# Patient Record
Sex: Male | Born: 1970 | Race: White | Hispanic: No | Marital: Married | Smoking: Current every day smoker
Health system: Southern US, Community
[De-identification: ages and names within clinical notes are randomized; demographics above are authoritative.]

---

## 2000-03-30 ENCOUNTER — Emergency Department (HOSPITAL_COMMUNITY): Admission: EM | Admit: 2000-03-30 | Discharge: 2000-03-30 | Payer: Self-pay | Admitting: Emergency Medicine

## 2011-04-19 ENCOUNTER — Emergency Department (HOSPITAL_BASED_OUTPATIENT_CLINIC_OR_DEPARTMENT_OTHER)
Admission: EM | Admit: 2011-04-19 | Discharge: 2011-04-19 | Disposition: A | Discharge status: Home / Self Care | Payer: Managed Care, Other (non HMO) | Attending: Emergency Medicine | Admitting: Emergency Medicine

## 2011-04-19 ENCOUNTER — Encounter (HOSPITAL_BASED_OUTPATIENT_CLINIC_OR_DEPARTMENT_OTHER): Payer: Self-pay | Admitting: *Deleted

## 2011-04-19 DIAGNOSIS — S61209A Unspecified open wound of unspecified finger without damage to nail, initial encounter: Secondary | ICD-10-CM | POA: Insufficient documentation

## 2011-04-19 DIAGNOSIS — W268XXA Contact with other sharp object(s), not elsewhere classified, initial encounter: Secondary | ICD-10-CM | POA: Insufficient documentation

## 2011-04-19 DIAGNOSIS — F17200 Nicotine dependence, unspecified, uncomplicated: Secondary | ICD-10-CM | POA: Insufficient documentation

## 2011-04-19 DIAGNOSIS — S61219A Laceration without foreign body of unspecified finger without damage to nail, initial encounter: Secondary | ICD-10-CM

## 2011-04-19 DIAGNOSIS — Y9269 Other specified industrial and construction area as the place of occurrence of the external cause: Secondary | ICD-10-CM | POA: Insufficient documentation

## 2011-04-19 MED ORDER — LIDOCAINE HCL 2 % IJ SOLN
20.0000 mL | Freq: Once | INTRAMUSCULAR | Status: DC
  Filled 2011-04-19: qty 1

## 2011-04-19 NOTE — ED Notes (Signed)
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 Approx 1-1/2 cm lac to left middle finger. Bleeding controlled. Moves finger . Feels touch. Cap refill < 3 sec.

## 2011-04-19 NOTE — Discharge Instructions (Signed)
Laceration Care, Adult A laceration is a cut that goes through all layers of the skin. The cut goes into the tissue beneath the skin. HOME CARE For stitches (sutures) or staples:  Keep the cut clean and dry.   If you have a bandage (dressing), change it at least once a day. Change the bandage if it gets wet or dirty, or as told by your doctor.   Wash the cut with soap and water 2 times a day. Rinse the cut with water. Pat it dry with a clean towel.   Put a thin layer of medicated cream on the cut as told by your doctor.   You may shower after the first 24 hours. Do not soak the cut in water until the stitches are removed.   Only take medicines as told by your doctor.   Have your stitches or staples removed as told by your doctor.  For skin adhesive strips:  Keep the cut clean and dry.   Do not get the strips wet. You may take a bath, but be careful to keep the cut dry.   If the cut gets wet, pat it dry with a clean towel.   The strips will fall off on their own. Do not remove the strips that are still stuck to the cut.  For wound glue:  You may shower or take baths. Do not soak or scrub the cut. Do not swim. Avoid heavy sweating until the glue falls off on its own. After a shower or bath, pat the cut dry with a clean towel.   Do not put medicine on your cut until the glue falls off.   If you have a bandage, do not put tape over the glue.   Avoid lots of sunlight or tanning lamps until the glue falls off. Put sunscreen on the cut for the first year to reduce your scar.   The glue will fall off on its own. Do not pick at the glue.  You may need a tetanus shot if:  You cannot remember when you had your last tetanus shot.   You have never had a tetanus shot.  If you need a tetanus shot and you choose not to have one, you may get tetanus. Sickness from tetanus can be serious. GET HELP RIGHT AWAY IF:   Your pain does not get better with medicine.   Your arm, hand, leg, or  foot loses feeling (numbness) or changes color.   Your cut is bleeding.   Your joint feels weak, or you cannot use your joint.   You have painful lumps on your body.   Your cut is red, puffy (swollen), or painful.   You have a red line on the skin near the cut.   You have yellowish-white fluid (pus) coming from the cut.   You have a fever.   You have a bad smell coming from the cut or bandage.   Your cut breaks open before or after stitches are removed.   You notice something coming out of the cut, such as wood or glass.   You cannot move a finger or toe.  MAKE SURE YOU:   Understand these instructions.   Will watch your condition.   Will get help right away if you are not doing well or get worse.  Document Released: 06/10/2007 Document Revised: 12/11/2010 Document Reviewed: 06/17/2010 [employer] Patient Information 2012 [employer].Stitches, Staples, or Skin Adhesive Strips  Stitches (sutures), staples, and skin adhesive strips hold   the skin together as it heals. They will usually be in place for 7 days or less. HOME CARE  Wash your hands with soap and water before and after you touch your wound.   Only take medicine as told by your doctor.   Cover your wound only if your doctor told you to. Otherwise, leave it open to air.   Do not get your stitches wet or dirty. If they get dirty, dab them gently with a clean washcloth. Wet the washcloth with soapy water. Do not rub. Pat them dry gently.   Do not put medicine or medicated cream on your stitches unless your doctor told you to.   Do not take out your own stitches or staples. Skin adhesive strips will fall off by themselves.   Do not pick at the wound. Picking can cause an infection.   Do not miss your follow-up appointment.   If you have problems or questions, call your doctor.  GET HELP RIGHT AWAY IF:   You have a temperature by mouth above 102 F (38.9 C), not controlled by medicine.   You have chills.     You have redness or pain around your stitches.   There is puffiness (swelling) around your stitches.   You notice fluid (drainage) from your stitches.   There is a bad smell coming from your wound.  MAKE SURE YOU:  Understand these instructions.   Will watch your condition.   Will get help if you are not doing well or get worse.  Document Released: 10/19/2008 Document Revised: 12/11/2010 Document Reviewed: 10/19/2008 [employer] Patient Information 2012 [employer]. 

## 2011-04-19 NOTE — ED Provider Notes (Signed)
History     CSN: [id number]  Arrival date & time 04/19/11  1850   First MD Initiated Contact with Patient 04/19/11 2006      Chief Complaint  Patient presents with  . Laceration    (Consider location/radiation/quality/duration/timing/severity/associated sxs/prior treatment) HPI Comments: Pt was cutting flooring at work and cut his finger with the razor and the pt states that he cut himself:pt denies any problem with sensation or movement  Patient is a 41 y.o. male presenting with skin laceration. The history is provided by the patient. No language interpreter was used.  Laceration  The incident occurred 1 to 2 hours ago. The laceration is located on the left hand. The laceration is 2 cm in size. The laceration mechanism was a a clean knife. The pain is moderate. The pain has been constant since onset. He reports no foreign bodies present. His tetanus status is UTD.    History reviewed. No pertinent past medical history.  History reviewed. No pertinent past surgical history.  History reviewed. No pertinent family history.  History  Substance Use Topics  . Smoking status: Current Everyday Smoker  . Smokeless tobacco: Not on file  . Alcohol Use: No      Review of Systems  Constitutional: Negative.   Respiratory: Negative.   Cardiovascular: Negative.   Musculoskeletal: Negative.   Skin: Positive for wound.  Neurological: Negative for numbness.    Allergies  Review of patient's allergies indicates no known allergies.  Home Medications   Current Outpatient Rx  Name Route Sig Dispense Refill  . FLUTICASONE PROPIONATE 50 MCG/ACT NA SUSP Nasal Place 2 sprays into the nose daily.      BP 148/85  Pulse 94  Temp(Src) 98.7 F (37.1 C) (Oral)  Resp 18  Ht 5\' 8"  (1.727 m)  Wt 174 lb (78.926 kg)  BMI 26.46 kg/m2  SpO2 99%  Physical Exam  Nursing note and vitals reviewed. Constitutional: He is oriented to person, place, and time. He appears well-developed and  well-nourished.  Cardiovascular: Normal rate and regular rhythm.   Pulmonary/Chest: Effort normal and breath sounds normal.  Musculoskeletal: Normal range of motion.  Neurological: He is alert and oriented to person, place, and time.  Skin:       Pt has a laceration to the dorsal aspect of the proximal joint of the left middle finger:cap refill <3    ED Course  LACERATION REPAIR Performed by: Teressa Lower Authorized by: Teressa Lower Consent: Written consent not obtained. Risks and benefits: risks, benefits and alternatives were discussed Consent given by: patient Patient understanding: patient states understanding of the procedure being performed Patient identity confirmed: verbally with patient Time out: Immediately prior to procedure a "time out" was called to verify the correct patient, procedure, equipment, support staff and site/side marked as required. Body area: upper extremity Location details: left long finger Laceration length: 2 cm Foreign bodies: no foreign bodies Anesthesia: digital block Local anesthetic: lidocaine 2% without epinephrine Irrigation solution: saline Irrigation method: syringe Amount of cleaning: standard Skin closure: 4-0 Prolene Number of sutures: 5 Technique: simple Approximation: close Approximation difficulty: simple Patient tolerance: Patient tolerated the procedure well with no immediate complications.   (including critical care time)  Labs Reviewed - No data to display No results found.   1. Finger laceration       MDM  Wound closed without any problem:tetanus utd        Teressa Lower, NP 04/19/11 2135

## 2011-04-19 NOTE — ED Provider Notes (Signed)
Medical screening examination/treatment/procedure(s) were performed by non-physician practitioner and as supervising physician I was immediately available for consultation/collaboration.   Forbes Cellar, MD 04/19/11 2329

## 2018-02-23 ENCOUNTER — Other Ambulatory Visit: Payer: Self-pay

## 2018-02-23 ENCOUNTER — Emergency Department (HOSPITAL_COMMUNITY): Payer: Managed Care, Other (non HMO)

## 2018-02-23 ENCOUNTER — Encounter (HOSPITAL_COMMUNITY): Payer: Self-pay | Admitting: Emergency Medicine

## 2018-02-23 ENCOUNTER — Emergency Department (HOSPITAL_COMMUNITY)
Admission: EM | Admit: 2018-02-23 | Discharge: 2018-02-24 | Disposition: A | Discharge status: Home / Self Care | Payer: Managed Care, Other (non HMO) | Attending: Emergency Medicine | Admitting: Emergency Medicine

## 2018-02-23 DIAGNOSIS — S0292XA Unspecified fracture of facial bones, initial encounter for closed fracture: Secondary | ICD-10-CM

## 2018-02-23 DIAGNOSIS — F17210 Nicotine dependence, cigarettes, uncomplicated: Secondary | ICD-10-CM | POA: Insufficient documentation

## 2018-02-23 DIAGNOSIS — W19XXXA Unspecified fall, initial encounter: Secondary | ICD-10-CM

## 2018-02-23 DIAGNOSIS — S0231XA Fracture of orbital floor, right side, initial encounter for closed fracture: Secondary | ICD-10-CM | POA: Insufficient documentation

## 2018-02-23 DIAGNOSIS — Y939 Activity, unspecified: Secondary | ICD-10-CM | POA: Insufficient documentation

## 2018-02-23 DIAGNOSIS — S0285XA Fracture of orbit, unspecified, initial encounter for closed fracture: Secondary | ICD-10-CM

## 2018-02-23 DIAGNOSIS — S0240EA Zygomatic fracture, right side, initial encounter for closed fracture: Secondary | ICD-10-CM | POA: Insufficient documentation

## 2018-02-23 DIAGNOSIS — W11XXXA Fall on and from ladder, initial encounter: Secondary | ICD-10-CM | POA: Diagnosis not present

## 2018-02-23 DIAGNOSIS — S0990XA Unspecified injury of head, initial encounter: Secondary | ICD-10-CM | POA: Diagnosis present

## 2018-02-23 DIAGNOSIS — Y999 Unspecified external cause status: Secondary | ICD-10-CM | POA: Insufficient documentation

## 2018-02-23 DIAGNOSIS — S20212A Contusion of left front wall of thorax, initial encounter: Secondary | ICD-10-CM | POA: Diagnosis not present

## 2018-02-23 DIAGNOSIS — Y929 Unspecified place or not applicable: Secondary | ICD-10-CM | POA: Diagnosis not present

## 2018-02-23 NOTE — ED Provider Notes (Signed)
[facility] Note   CSN: [id number] Arrival date & time: 02/23/18  1748    History   Chief Complaint Chief Complaint  Patient presents with  . Facial Fx  . Chest Pain    Non-cardiac    HPI Sherian MaroonGeorge Grumbine is a 48 y.o. male.  The history is provided by the patient.  Chest Pain  He fell off of a ladder, approximately 4 feet, and landed on a steel tub.  He hit his face and chipped a tooth, and also hit his chest on the right side.  There was a loss of consciousness.  He saw his dentist who but it temporary filling was in the chipped tooth and stated he saw facial fractures on a [facility] x-ray and referred him to the ED.  He does complain of numbness in the right side of his face.  There is been no nausea or vomiting.  He denies any difficulty breathing.  Pain is rated at 6/10.  History reviewed. No pertinent past medical history.  There are no active problems to display for this patient.   History reviewed. No pertinent surgical history.      Home Medications    Prior to Admission medications   Medication Sig Start Date End Date Taking? Authorizing Provider  fluticasone (FLONASE) 50 MCG/ACT nasal spray Place 2 sprays into the nose daily.    [provider]    Family History No family history on file.  Social History Social History   Tobacco Use  . Smoking status: Current Every Day Smoker    Packs/day: 1.00    Types: Cigarettes  . Smokeless tobacco: Never Used  Substance Use Topics  . Alcohol use: No  . Drug use: No     Allergies   Patient has no known allergies.   Review of Systems Review of Systems  Cardiovascular: Positive for chest pain.  All other systems reviewed and are negative.    Physical Exam Updated Vital Signs BP 134/86   Pulse 64   Temp 98.4 F (36.9 C) (Oral)   Resp 20   Ht 5\' 7"  (1.702 m)   Wt 79.4 kg   SpO2 98%   BMI 27.41 kg/m   Physical Exam Vitals signs and nursing  note reviewed.    48 year old male, resting comfortably and in no acute distress. Vital signs are normal. Oxygen saturation is 98%, which is normal. Head is normocephalic.  No obvious facial swelling or deformity.  Moderate tenderness to palpation over the right malar area without crepitus. PERRLA, EOMI. Oropharynx is clear.  No objective sensory loss. Neck is nontender  without adenopathy or JVD. Back is nontender and there is no CVA tenderness. Lungs are clear without rales, wheezes, or rhonchi. Chest is mildly tender in the right anterior chest wall without crepitus. Heart has regular rate and rhythm without murmur. Abdomen is soft, flat, nontender without masses or hepatosplenomegaly and peristalsis is normoactive. Extremities have no cyanosis or edema, full range of motion is present. Skin is warm and dry without rash. Neurologic: Mental status is normal, cranial nerves are intact, there are no motor or sensory deficits.  ED Treatments / Results   Radiology Dg Facial Bones Complete  Result Date: 02/23/2018 CLINICAL DATA:  Fall from ladder.  Right-sided facial pain. EXAM: FACIAL BONES COMPLETE 3+V COMPARISON:  None. FINDINGS: Sinuses are patent. Mandible is intact. No acute fractures are present. Degenerative changes are present in the cervical spine without acute abnormality.  IMPRESSION: 1. No acute abnormality. 2. Degenerative changes of the cervical spine. Electronically Signed   By: Marin Roberts M.D.   On: 02/23/2018 19:21   Dg Chest 2 View  Result Date: 02/23/2018 CLINICAL DATA:  48 y/o M; fall from ladder today. Right-sided pain and numbness of the right-sided face. EXAM: CHEST - 2 VIEW COMPARISON:  None. FINDINGS: The heart size and mediastinal contours are within normal limits. Both lungs are clear. No displaced fracture identified. IMPRESSION: No active cardiopulmonary disease. No displaced fracture identified. Electronically Signed   By: Mitzi Hansen M.D.    On: 02/23/2018 19:21   Ct Head Wo Contrast  Result Date: 02/24/2018 CLINICAL DATA:  48 year old male status post fall from ladder striking face, loss of consciousness, right facial pain. EXAM: CT HEAD WITHOUT CONTRAST CT MAXILLOFACIAL WITHOUT CONTRAST CT CERVICAL SPINE WITHOUT CONTRAST TECHNIQUE: Multidetector CT imaging of the head, cervical spine, and maxillofacial structures were performed using the standard protocol without intravenous contrast. Multiplanar CT image reconstructions of the cervical spine and maxillofacial structures were also generated. COMPARISON:  Facial radiographs earlier today. FINDINGS: CT HEAD FINDINGS Brain: No midline shift, ventriculomegaly, mass effect, evidence of mass lesion, intracranial hemorrhage or evidence of cortically based acute infarction. Gray-white matter differentiation is within normal limits throughout the brain. Vascular: No suspicious intracranial vascular hyperdensity. Skull: No calvarium fracture. Facial findings are below. Other: No discrete scalp hematoma. Tympanic cavities and mastoids are clear. CT MAXILLOFACIAL FINDINGS Osseous: Mandible appears intact. There is a mildly comminuted but minimally displaced fracture of the anterior wall of the right maxillary sinus (series 8, image 53). This fracture tracks toward the lateral right maxillary alveolus, with no direct dental involvement (coronal image 39). There is a nondisplaced fracture of the right zygomatic arch on image 60. There is a largely nondisplaced fracture of the posterior wall of the right maxillary sinus on the same image. Nasal bones, left maxilla and left zygoma appear intact. Central skull base appears intact. Orbits: Left orbital walls are intact. There is a nondisplaced fracture of the right orbital floor (series 11, image 33). There is trace right intraorbital extraconal gas. No herniated orbital contents. No orbital hematoma. No lateral orbital wall fracture identified. Sinuses: Trace  mucosal thickening. No hemorrhage layering in the sinuses. Soft tissues: Negative visible noncontrast larynx, pharynx, parapharyngeal spaces, retropharyngeal space, sublingual space, submandibular spaces, parotid spaces, and masticator spaces. There is a broad-based right mandible region superficial soft tissue contusion or hematoma on series 7, image 16. Visible upper cervical nodes appear normal. CT CERVICAL SPINE FINDINGS Alignment: Straightening and mild reversal of cervical lordosis. Cervicothoracic junction alignment is within normal limits. Bilateral posterior element alignment is within normal limits. Skull base and vertebrae: Visualized skull base is intact. No atlanto-occipital dissociation. No cervical spine fracture identified. Soft tissues and spinal canal: No prevertebral fluid or swelling. No visible canal hematoma. Disc levels: Disc and endplate degeneration maximal at C4-C5 and C5-C6 with up to mild associated cervical spinal stenosis. Upper chest: Visible upper thoracic levels appear grossly intact. Negative lung apices. Elongated stylohyoid ligament calcification greater Other: On the right. IMPRESSION: 1. Minimally to non-displaced fractures of: - anterior and posterior walls of the right maxillary sinus. - right orbital floor. - right zygomatic arch. 2. Normal noncontrast CT appearance of the brain. 3. No acute traumatic injury identified in the cervical spine. Mild degenerative spinal stenosis suspected. Electronically Signed   By: Odessa Fleming M.D.   On: 02/24/2018 00:42   Ct Cervical Spine Wo  Contrast  Result Date: 02/24/2018 CLINICAL DATA:  48 year old male status post fall from ladder striking face, loss of consciousness, right facial pain. EXAM: CT HEAD WITHOUT CONTRAST CT MAXILLOFACIAL WITHOUT CONTRAST CT CERVICAL SPINE WITHOUT CONTRAST TECHNIQUE: Multidetector CT imaging of the head, cervical spine, and maxillofacial structures were performed using the standard protocol without  intravenous contrast. Multiplanar CT image reconstructions of the cervical spine and maxillofacial structures were also generated. COMPARISON:  Facial radiographs earlier today. FINDINGS: CT HEAD FINDINGS Brain: No midline shift, ventriculomegaly, mass effect, evidence of mass lesion, intracranial hemorrhage or evidence of cortically based acute infarction. Gray-white matter differentiation is within normal limits throughout the brain. Vascular: No suspicious intracranial vascular hyperdensity. Skull: No calvarium fracture. Facial findings are below. Other: No discrete scalp hematoma. Tympanic cavities and mastoids are clear. CT MAXILLOFACIAL FINDINGS Osseous: Mandible appears intact. There is a mildly comminuted but minimally displaced fracture of the anterior wall of the right maxillary sinus (series 8, image 53). This fracture tracks toward the lateral right maxillary alveolus, with no direct dental involvement (coronal image 39). There is a nondisplaced fracture of the right zygomatic arch on image 60. There is a largely nondisplaced fracture of the posterior wall of the right maxillary sinus on the same image. Nasal bones, left maxilla and left zygoma appear intact. Central skull base appears intact. Orbits: Left orbital walls are intact. There is a nondisplaced fracture of the right orbital floor (series 11, image 33). There is trace right intraorbital extraconal gas. No herniated orbital contents. No orbital hematoma. No lateral orbital wall fracture identified. Sinuses: Trace mucosal thickening. No hemorrhage layering in the sinuses. Soft tissues: Negative visible noncontrast larynx, pharynx, parapharyngeal spaces, retropharyngeal space, sublingual space, submandibular spaces, parotid spaces, and masticator spaces. There is a broad-based right mandible region superficial soft tissue contusion or hematoma on series 7, image 16. Visible upper cervical nodes appear normal. CT CERVICAL SPINE FINDINGS Alignment:  Straightening and mild reversal of cervical lordosis. Cervicothoracic junction alignment is within normal limits. Bilateral posterior element alignment is within normal limits. Skull base and vertebrae: Visualized skull base is intact. No atlanto-occipital dissociation. No cervical spine fracture identified. Soft tissues and spinal canal: No prevertebral fluid or swelling. No visible canal hematoma. Disc levels: Disc and endplate degeneration maximal at C4-C5 and C5-C6 with up to mild associated cervical spinal stenosis. Upper chest: Visible upper thoracic levels appear grossly intact. Negative lung apices. Elongated stylohyoid ligament calcification greater Other: On the right. IMPRESSION: 1. Minimally to non-displaced fractures of: - anterior and posterior walls of the right maxillary sinus. - right orbital floor. - right zygomatic arch. 2. Normal noncontrast CT appearance of the brain. 3. No acute traumatic injury identified in the cervical spine. Mild degenerative spinal stenosis suspected. Electronically Signed   By: Odessa Fleming M.D.   On: 02/24/2018 00:42   Ct Maxillofacial Wo Contrast  Result Date: 02/24/2018 CLINICAL DATA:  48 year old male status post fall from ladder striking face, loss of consciousness, right facial pain. EXAM: CT HEAD WITHOUT CONTRAST CT MAXILLOFACIAL WITHOUT CONTRAST CT CERVICAL SPINE WITHOUT CONTRAST TECHNIQUE: Multidetector CT imaging of the head, cervical spine, and maxillofacial structures were performed using the standard protocol without intravenous contrast. Multiplanar CT image reconstructions of the cervical spine and maxillofacial structures were also generated. COMPARISON:  Facial radiographs earlier today. FINDINGS: CT HEAD FINDINGS Brain: No midline shift, ventriculomegaly, mass effect, evidence of mass lesion, intracranial hemorrhage or evidence of cortically based acute infarction. Gray-white matter differentiation is within normal limits throughout  the brain. Vascular:  No suspicious intracranial vascular hyperdensity. Skull: No calvarium fracture. Facial findings are below. Other: No discrete scalp hematoma. Tympanic cavities and mastoids are clear. CT MAXILLOFACIAL FINDINGS Osseous: Mandible appears intact. There is a mildly comminuted but minimally displaced fracture of the anterior wall of the right maxillary sinus (series 8, image 53). This fracture tracks toward the lateral right maxillary alveolus, with no direct dental involvement (coronal image 39). There is a nondisplaced fracture of the right zygomatic arch on image 60. There is a largely nondisplaced fracture of the posterior wall of the right maxillary sinus on the same image. Nasal bones, left maxilla and left zygoma appear intact. Central skull base appears intact. Orbits: Left orbital walls are intact. There is a nondisplaced fracture of the right orbital floor (series 11, image 33). There is trace right intraorbital extraconal gas. No herniated orbital contents. No orbital hematoma. No lateral orbital wall fracture identified. Sinuses: Trace mucosal thickening. No hemorrhage layering in the sinuses. Soft tissues: Negative visible noncontrast larynx, pharynx, parapharyngeal spaces, retropharyngeal space, sublingual space, submandibular spaces, parotid spaces, and masticator spaces. There is a broad-based right mandible region superficial soft tissue contusion or hematoma on series 7, image 16. Visible upper cervical nodes appear normal. CT CERVICAL SPINE FINDINGS Alignment: Straightening and mild reversal of cervical lordosis. Cervicothoracic junction alignment is within normal limits. Bilateral posterior element alignment is within normal limits. Skull base and vertebrae: Visualized skull base is intact. No atlanto-occipital dissociation. No cervical spine fracture identified. Soft tissues and spinal canal: No prevertebral fluid or swelling. No visible canal hematoma. Disc levels: Disc and endplate degeneration  maximal at C4-C5 and C5-C6 with up to mild associated cervical spinal stenosis. Upper chest: Visible upper thoracic levels appear grossly intact. Negative lung apices. Elongated stylohyoid ligament calcification greater Other: On the right. IMPRESSION: 1. Minimally to non-displaced fractures of: - anterior and posterior walls of the right maxillary sinus. - right orbital floor. - right zygomatic arch. 2. Normal noncontrast CT appearance of the brain. 3. No acute traumatic injury identified in the cervical spine. Mild degenerative spinal stenosis suspected. Electronically Signed   By: Odessa Fleming M.D.   On: 02/24/2018 00:42    Procedures Procedures  Medications Ordered in ED Medications - No data to display   Initial Impression / Assessment and Plan / ED Course  I have reviewed the triage vital signs and the nursing notes.  Pertinent imaging results that were available during my care of the patient were reviewed by me and considered in my medical decision making (see chart for details).  Fall with injury to face and chest along with loss of consciousness.  X-rays obtained from triage including facial bones and chest x-ray showed no acute injury.  With loss of consciousness, will send for CT of head, and also obtain CT of maxillofacial bones and cervical spine.  Old records are reviewed, and he has no relevant past visits.  CT scan does show nondisplaced fracture of the right zygoma, fractures of the wall of the right maxillary sinus and inferior orbital wall.  These are all likely to heal in place without need for additional treatment.  He is advised on ice and use of over-the-counter analgesics.  Follow-up with ENT if any concerns.  Final Clinical Impressions(s) / ED Diagnoses   Final diagnoses:  Fall from ladder, initial encounter  Closed fracture of right orbit, initial encounter  Closed fracture of facial bone due to fall, initial encounter (HCC)  Chest wall contusion,  left, initial encounter     ED Discharge Orders    None       Dione BoozeGlick, Erminie Foulks, MD 02/24/18 [phone]

## 2018-02-23 NOTE — ED Triage Notes (Addendum)
Pt reports he was on a 4 ft and fell striking his face and right chest wall. Pt reports going to the dentist and being told he had a facial fx, was told to come to the ED. Pt reports a positive LOC.

## 2018-02-24 ENCOUNTER — Emergency Department (HOSPITAL_COMMUNITY): Payer: Managed Care, Other (non HMO)

## 2018-02-24 NOTE — Discharge Instructions (Addendum)
Apply ice for 20-30 minutes at a time, 3-4 times a day.  Take acetaminophen and/or ibuprofen as needed for pain.  Your CT scan does show several fractures of facial bones, but they are all in good alignment and unlikely to need any special care.  If you have any concerns, follow-up with the ENT physician.

## 2019-03-08 ENCOUNTER — Emergency Department (HOSPITAL_COMMUNITY)
Admission: EM | Admit: 2019-03-08 | Discharge: 2019-03-08 | Disposition: A | Discharge status: Home / Self Care | Payer: 59 | Attending: Emergency Medicine | Admitting: Emergency Medicine

## 2019-03-08 ENCOUNTER — Emergency Department (HOSPITAL_COMMUNITY): Payer: 59

## 2019-03-08 ENCOUNTER — Encounter (HOSPITAL_COMMUNITY): Payer: Self-pay | Admitting: Emergency Medicine

## 2019-03-08 ENCOUNTER — Other Ambulatory Visit: Payer: Self-pay

## 2019-03-08 DIAGNOSIS — F17210 Nicotine dependence, cigarettes, uncomplicated: Secondary | ICD-10-CM | POA: Diagnosis not present

## 2019-03-08 DIAGNOSIS — Z20822 Contact with and (suspected) exposure to covid-19: Secondary | ICD-10-CM | POA: Insufficient documentation

## 2019-03-08 DIAGNOSIS — Z23 Encounter for immunization: Secondary | ICD-10-CM | POA: Insufficient documentation

## 2019-03-08 DIAGNOSIS — W312XXA Contact with powered woodworking and forming machines, initial encounter: Secondary | ICD-10-CM | POA: Diagnosis not present

## 2019-03-08 DIAGNOSIS — Y929 Unspecified place or not applicable: Secondary | ICD-10-CM | POA: Diagnosis not present

## 2019-03-08 DIAGNOSIS — Y998 Other external cause status: Secondary | ICD-10-CM | POA: Diagnosis not present

## 2019-03-08 DIAGNOSIS — S61311A Laceration without foreign body of left index finger with damage to nail, initial encounter: Secondary | ICD-10-CM

## 2019-03-08 DIAGNOSIS — S6992XA Unspecified injury of left wrist, hand and finger(s), initial encounter: Secondary | ICD-10-CM | POA: Diagnosis present

## 2019-03-08 DIAGNOSIS — S61301A Unspecified open wound of left index finger with damage to nail, initial encounter: Secondary | ICD-10-CM | POA: Insufficient documentation

## 2019-03-08 DIAGNOSIS — S62661B Nondisplaced fracture of distal phalanx of left index finger, initial encounter for open fracture: Secondary | ICD-10-CM | POA: Diagnosis not present

## 2019-03-08 DIAGNOSIS — S62651B Nondisplaced fracture of medial phalanx of left index finger, initial encounter for open fracture: Secondary | ICD-10-CM | POA: Insufficient documentation

## 2019-03-08 DIAGNOSIS — Y9389 Activity, other specified: Secondary | ICD-10-CM | POA: Diagnosis not present

## 2019-03-08 DIAGNOSIS — S61209A Unspecified open wound of unspecified finger without damage to nail, initial encounter: Secondary | ICD-10-CM

## 2019-03-08 LAB — RESPIRATORY PANEL BY RT PCR (FLU A&B, COVID)
Influenza A by PCR: NEGATIVE
Influenza B by PCR: NEGATIVE
SARS Coronavirus 2 by RT PCR: NEGATIVE

## 2019-03-08 MED ORDER — ACETAMINOPHEN 500 MG PO TABS
1000.0000 mg | ORAL_TABLET | Freq: Once | ORAL | Status: AC
  Administered 2019-03-08: 17:00:00 1000 mg via ORAL
  Filled 2019-03-08: qty 2

## 2019-03-08 MED ORDER — CEFAZOLIN SODIUM-DEXTROSE 1-4 GM/50ML-% IV SOLN
1.0000 g | Freq: Once | INTRAVENOUS | Status: AC
  Administered 2019-03-08: 18:00:00 1 g via INTRAVENOUS
  Filled 2019-03-08: qty 50

## 2019-03-08 MED ORDER — CEPHALEXIN 500 MG PO CAPS: 500.0000 mg | ORAL_CAPSULE | Freq: Four times a day (QID) | ORAL | 0 refills | Status: AC

## 2019-03-08 MED ORDER — LIDOCAINE HCL (PF) 1 % IJ SOLN
30.0000 mL | Freq: Once | INTRAMUSCULAR | Status: AC
  Administered 2019-03-08: 18:00:00 30 mL
  Filled 2019-03-08: qty 30

## 2019-03-08 MED ORDER — TETANUS-DIPHTH-ACELL PERTUSSIS 5-2.5-18.5 LF-MCG/0.5 IM SUSP
0.5000 mL | Freq: Once | INTRAMUSCULAR | Status: AC
  Administered 2019-03-08: 18:00:00 0.5 mL via INTRAMUSCULAR
  Filled 2019-03-08: qty 0.5

## 2019-03-08 MED ORDER — BACITRACIN ZINC 500 UNIT/GM EX OINT
TOPICAL_OINTMENT | Freq: Once | CUTANEOUS | Status: AC
  Administered 2019-03-08: 1 via TOPICAL
  Filled 2019-03-08: qty 0.9

## 2019-03-08 MED ORDER — MUPIROCIN CALCIUM 2 % EX CREA
TOPICAL_CREAM | Freq: Once | CUTANEOUS | Status: DC
  Filled 2019-03-08: qty 15

## 2019-03-08 NOTE — ED Provider Notes (Addendum)
[facility]-EMERGENCY DEPT Provider Note   CSN: [id number] Arrival date & time: 03/08/19  1509     History Chief Complaint  Patient presents with  . Finger Injury    Oscar Donaldson is a 49 y.o. male.  HPI  Patient is a 49 year old male past medical history presented today with left hand injury that occurred 2 hours prior to arrival.  Patient states he was using a table saw when his hand got pulled into the sawblade which damaged his left second, third and fourth digit.  He states that he was able to control the bleeding with direct pressure.  He went to urgent care but they referred him to emergency department to see hand specialist.  Patient states that he is left-handed for writing but right-handed for work and able to use both to eat and do most ATLs.   Patient states that he is up-to-date on his tetanus.  He states pain is 10/10, nonradiating, constant, achy, worse with movement and touch.  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  He has not taken any pain medications prior to arrival.    History reviewed. No pertinent past medical history.  There are no problems to display for this patient.   History reviewed. No pertinent surgical history.     History reviewed. No pertinent family history.  Social History   Tobacco Use  . Smoking status: Current Every Day Smoker    Packs/day: 1.00    Types: Cigarettes  . Smokeless tobacco: Never Used  Substance Use Topics  . Alcohol use: No  . Drug use: No    Home Medications Prior to Admission medications   Medication Sig Start Date End Date Taking? Authorizing Provider  cephALEXin (KEFLEX) 500 MG capsule Take 1 capsule (500 mg total) by mouth 4 (four) times daily for 7 days. 03/08/19 03/15/19  Gailen Shelter, PA    Allergies    Patient has no known allergies.  Review of Systems   Review of Systems  Constitutional: Negative for chills  and fever.  HENT: Negative for congestion.   Respiratory: Negative for shortness of breath.   Cardiovascular: Negative for chest pain.  Gastrointestinal: Negative for abdominal pain.  Musculoskeletal: Negative for neck pain.    Physical Exam Updated Vital Signs BP (!) 133/95   Pulse 63   Temp 97.9 F (36.6 C) (Oral)   Resp 20   Ht 5\' 6"  (1.676 m)   Wt 86.2 kg   SpO2 98%   BMI 30.67 kg/m   Physical Exam Vitals and nursing note reviewed.  Constitutional:      General: He is not in acute distress.    Appearance: Normal appearance. He is not ill-appearing.     Comments: Patient is uncomfortable, pleasant 49 year old gentleman.  HENT:     Head: Normocephalic and atraumatic.     Mouth/Throat:     Mouth: Mucous membranes are moist.  Eyes:     General: No scleral icterus.       Right eye: No discharge.        Left eye: No discharge.     Conjunctiva/sclera: Conjunctivae normal.  Cardiovascular:     Rate and Rhythm: Normal rate.     Pulses: Normal pulses.     Comments: Intact bilateral radial pulses 3+ and symmetric. Pulmonary:     Effort: Pulmonary effort is normal.     Breath sounds: No stridor.  Musculoskeletal:     Comments: Patient is unable to flex at the DIP or PIP but strength is 5/5 at the MCP with flexion extension.  Notably: After digital block patient was still unable to flex DIP or PIP finger at all.   Skin:    Capillary Refill: Capillary refill takes less than 2 seconds. Second third and fourth digit with distal cap refill Neurological:     Mental Status: He is alert and oriented to person, place, and time. Mental status is at baseline.     Comments: Patient has grossly intact sensation all fingertips  Psychiatric:        Mood and Affect: Mood normal.        Behavior: Behavior normal.               ED Results / Procedures / Treatments   Labs (all labs ordered are listed, but only abnormal results are displayed) Labs Reviewed  RESPIRATORY  PANEL BY RT PCR (FLU A&B, COVID)    EKG None  Radiology DG Hand Complete Left  Result Date: 03/08/2019 CLINICAL DATA:  Lacerations EXAM: LEFT HAND - COMPLETE 3+ VIEW COMPARISON:  None. FINDINGS: Soft tissue lacerations at the distal second third and fourth digits. Tubular lucency extending from the tuft to the articular surface base of the second distal phalanx presumably fracture and laceration. Apparent bony amputation of the middle phalanx from head to base of the phalanx on the radial side. Small fracture fragments adjacent to the base of the second middle phalanx with defect at the head of the proximal phalanx. IMPRESSION: 1. Apparent bony amputation of the second middle phalanx oriented along the long axis of the bone, and extending from the head of the phalanx to the articular surface base. Small displaced fracture involving the head of the proximal phalanx of the second digit. Tubular lucency extends from the articular surface base of the distal phalanx to the tuft and would also be consistent with acute osseous injury. Electronically Signed   By: Donavan Foil M.D.   On: 03/08/2019 16:47    Procedures .Marland KitchenLaceration Repair  Date/Time: 03/08/2019 10:07 PM Performed by: Tedd Sias, PA Authorized by: Tedd Sias, PA   Consent:    Consent obtained:  Verbal   Consent given by:  Patient   Risks discussed:  Infection, need for additional repair, pain, poor cosmetic result and poor wound healing   Alternatives discussed:  No treatment and delayed treatment Universal protocol:    Procedure explained and questions answered to patient or proxy's satisfaction: yes     Relevant documents present and verified: yes     Test results available and properly labeled: yes     Imaging studies available: yes     Required blood products, implants, devices, and special equipment available: yes     Site/side marked: yes     Immediately prior to procedure, a time out was called: yes     Patient  identity confirmed:  Verbally with patient Anesthesia (see MAR for exact dosages):    Anesthesia method:  Local infiltration   Local anesthetic:  Lidocaine 1% w/o epi Laceration details:    Location:  Finger   Finger location:  L index finger   Length (cm):  3 Repair type:    Repair type:  Intermediate Exploration:    Hemostasis achieved with:  Direct pressure   Wound exploration: wound explored through full range of motion and entire depth of wound probed and visualized  Wound exploration comment:  Extensive wound exploration.  There is visible bone of the middle phalanx and gaping wound.   Wound extent: no foreign bodies/material noted and no tendon damage noted     Contaminated: yes (Wound is surrounded by dirt and oil on patient's hands.)   Treatment:    Area cleansed with:  Saline   Amount of cleaning:  Standard   Irrigation solution:  Sterile saline   Irrigation method:  Pressure wash   Visualized foreign bodies/material removed: no   Skin repair:    Repair method:  Sutures   Suture size:  4-0   Suture material:  Prolene   Suture technique:  Simple interrupted   Number of sutures:  6 Approximation:    Approximation:  Loose Post-procedure details:    Dressing:  Antibiotic ointment and non-adherent dressing   Patient tolerance of procedure:  Tolerated well, no immediate complications Comments:     Patient placed in splint so was unable to move second digit.   (including critical care time)  SPLINT APPLICATION Date/Time: 1:50 PM Authorized by: Gailen Shelter Consent: Verbal consent obtained. Risks and benefits: risks, benefits and alternatives were discussed Consent given by: patient Splint applied by: orthopedic technician Location details: left index finger Splint type: static finger splint Supplies used: prefabricated foam/metal splint with coban. Post-procedure: The splinted body part was neurovascularly unchanged following the procedure. Patient tolerance:  Patient tolerated the procedure well with no immediate complications.     Medications Ordered in ED Medications  acetaminophen (TYLENOL) tablet 1,000 mg (1,000 mg Oral Given 03/08/19 1652)  Tdap (BOOSTRIX) injection 0.5 mL (0.5 mLs Intramuscular Given 03/08/19 1815)  ceFAZolin (ANCEF) IVPB 1 g/50 mL premix (0 g Intravenous Stopped 03/08/19 1849)  lidocaine (PF) (XYLOCAINE) 1 % injection 30 mL (30 mLs Infiltration Given 03/08/19 1815)  bacitracin ointment (1 application Topical Given 03/08/19 2036)    ED Course  I have reviewed the triage vital signs and the nursing notes.  Pertinent labs & imaging results that were available during my care of the patient were reviewed by me and considered in my medical decision making (see chart for details).    MDM Rules/Calculators/A&P                      Patient is a 49 year old gentleman with extensive left hand laceration and multiple fingertip damage after tablesaw incident today at approximately 1 PM. See pictures above as well as physical exam for details.  In short, second, third, fourth digit fingertip extensive avulsions of skin and nail and second digit had fracture of the distal phalanx as well as bony amputation of the radial side of the middle phalanx where the table saw sheared off part of the bone.  This bone is still exposed and wound is gaping open.  Patient is unable to move his digit at all other than at the MCP.  He is unable to flex or extend PIP or DIP.  Given extensively mangled appearance of finger and inability of patient to flex or extend 2nd digit PCP/DIP I discussed the case with my supervising physician Dr. Elonda Husky (who left at shift change shortly after).   I discussed this case with Dr. Melvyn Novas of hand surgery who recommended follow-up in clinic on Friday. After extensive cleaning and exploration in a bloodless field using a finger tourniquet there appears to be exposed bone that has been cut by the table saw.  Distal phalanx is  also fractured--seen on plain film.  As this is an open fracture I provided patient with Ancef 1 g and updated tetanus.  Patient offered stronger pain medication however he would prefer Tylenol.  Initial page was answered by a nursing staff member who conveyed my concerns to Dr. Melvyn Novas as he is in surgery at this time. He recommended follow up in clinic in 2 days. When asked about closure it was conveyed that it could either be closed or left open.   I discussed the case with Dr. Jacqulyn Bath who is my attending physician (took over at shift change for Dr. Elonda Husky) who assessed patient at bedside.  After bedside assessment, recommended repage of hand surgery to further discuss care of patient. Dr. Melvyn Novas returned page.   I requested Dr. Melvyn Novas see patient in ED tonight.  He states he would prefer to see patient in clinic tomorrow.  Concerns expressed for potential need for emergent operation on hand as it is severely mangled--these concerns were heard and acknowledged by Dr. Melvyn Novas who states he will see patient tomorrow. Dr. Melvyn Novas recommended loose closure and splint.   Pressure irrigation performed. Wound explored and base of wound visualized in a bloodless field without evidence of foreign body.  Laceration occurred < 8 hours prior to repair which was well tolerated. Tdap updated.  Pt has no comorbidities to effect normal wound healing. Pt discharged with keflex for antibiotic coverage.  Discussed suture home care with patient and answered questions. Pt to follow-up for wound check and reassessment in clinic tomorrow with Dr. Melvyn Novas. Pt is hemodynamically stable with no complaints prior to dc. Finger splint placed. Antibiotics given in ED and at discharge.  I discussed with patient at length the very important need for him to follow-up promptly at 1 PM tomorrow and Dr. Marney Doctor clinic.  Patient given clinic admission and phone number.  He will follow up.  He is understanding of this plan and is able to  teach back plan.  He states he will take antibiotics as prescribed.  Final Clinical Impression(s) / ED Diagnoses Final diagnoses:  Laceration of left index finger without foreign body with damage to nail, initial encounter  Nondisplaced fracture of middle phalanx of left index finger, initial encounter for open fracture  Open nondisplaced fracture of distal phalanx of left index finger, initial encounter  Avulsion of finger tip, initial encounter    Rx / DC Orders ED Discharge Orders         Ordered    cephALEXin (KEFLEX) 500 MG capsule  4 times daily     03/08/19 2002           Gailen Shelter, [place] 03/08/19 2220    Solon Augusta [place], [place] 03/09/19 1350    Maia Plan, MD 03/11/19 [phone]

## 2019-03-08 NOTE — Discharge Instructions (Signed)
Please present to Dr. Glenna Durand office tomorrow at approximately 1 PM.  Please take antibiotics as prescribed.  Please keep bandage in place.  Please wear splint until you are seen tomorrow.

## 2019-03-08 NOTE — ED Triage Notes (Signed)
The Patient was cutting wood on a table saw when he lacerated 3 finger on the left hand. Patient went to urgent care. Urgent care told him he would probably need a hand surgeon and sent him here.

## 2019-10-13 IMAGING — CT CT CERVICAL SPINE W/O CM
2 of 11 series · 6 of 33 positions shown, 7 images · non-contrast
Comparison: Facial radiographs earlier today.

CLINICAL DATA: 47-year-old male status post fall from ladder
striking face, loss of consciousness, right facial pain.

EXAM:
CT HEAD WITHOUT CONTRAST
CT MAXILLOFACIAL WITHOUT CONTRAST
CT CERVICAL SPINE WITHOUT CONTRAST
TECHNIQUE: Multidetector CT imaging of the head, cervical spine, and
maxillofacial structures were performed using the standard protocol
without intravenous contrast. Multiplanar CT image reconstructions
of the cervical spine and maxillofacial structures were also
generated.

[Series 7: facialbone 2.0 st · axial · 0.38mm/px · z∈[-295,-103]mm · 3 of 97 slices shown, 4 images]
[im 1/97  soft-tissue]
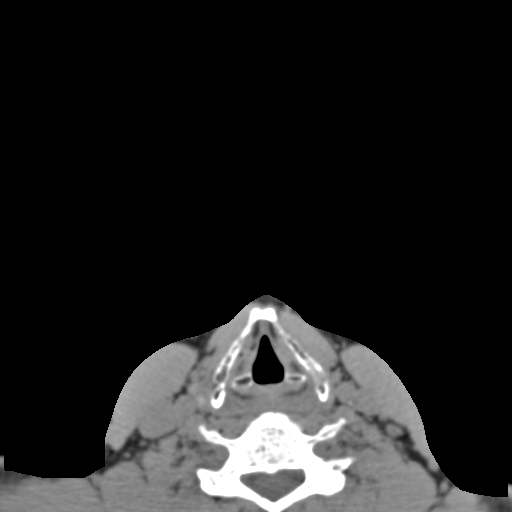
[im 1/97  bone]
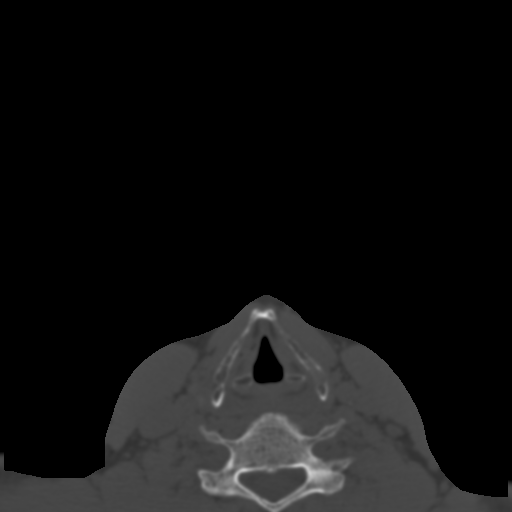
[im 49/97  bone]
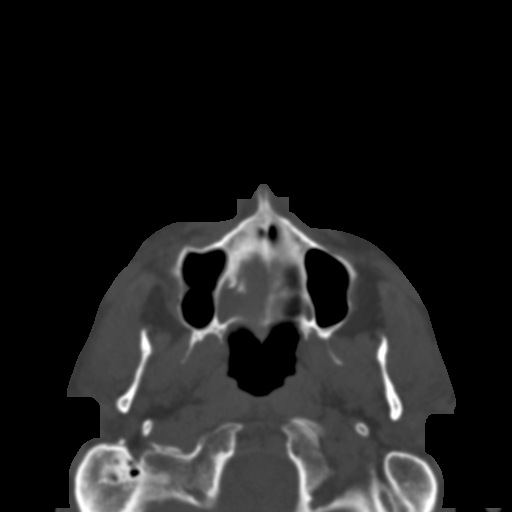
[im 97/97  bone]
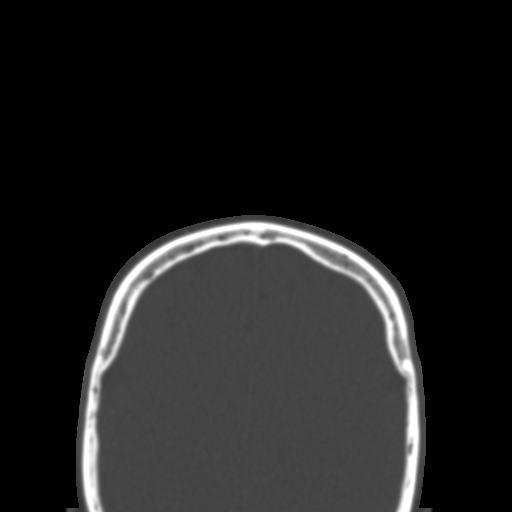

[Series 14: facialbone 2.0 sag st · sagittal · 0.34mm/px · 3 of 88 slices shown]
[im 22/88  bone]
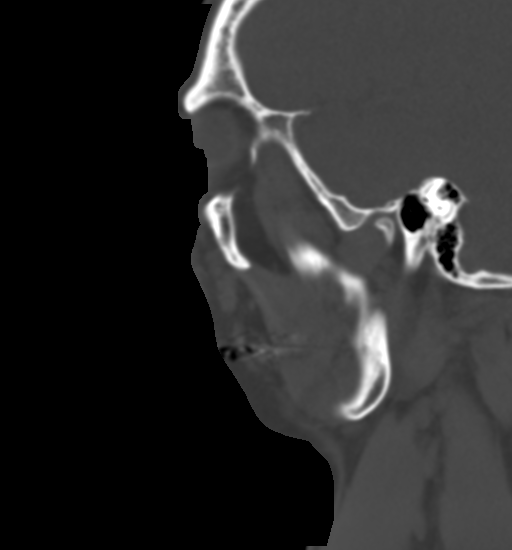
[im 44/88  bone]
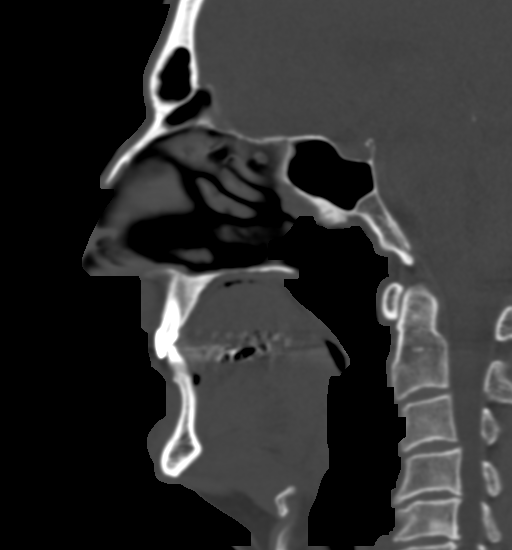
[im 66/88  bone]
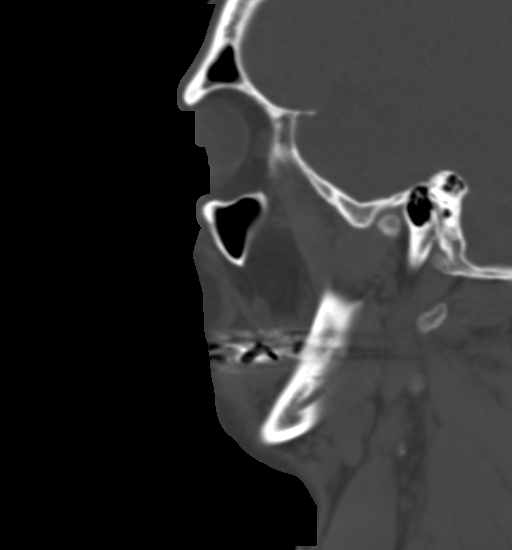

[6 of 33 positions shown; findings below may reference images not displayed]

FINDINGS: CT HEAD FINDINGS

Brain: No midline shift, ventriculomegaly, mass effect, evidence of
mass lesion, intracranial hemorrhage or evidence of cortically based
acute infarction. Gray-white matter differentiation is within normal
limits throughout the brain.

Vascular: No suspicious intracranial vascular hyperdensity.

Skull: No calvarium fracture. Facial findings are below.

Other: No discrete scalp hematoma. Tympanic cavities and mastoids
are clear.

CT MAXILLOFACIAL FINDINGS

Osseous: Mandible appears intact.

There is a mildly comminuted but minimally displaced fracture of the
anterior wall of the right maxillary sinus (series 8, image 53).
This fracture tracks toward the lateral right maxillary alveolus,
with no direct dental involvement (coronal image 39). There is a
nondisplaced fracture of the right zygomatic arch on image 60. There
is a largely nondisplaced fracture of the posterior wall of the
right maxillary sinus on the same image.

Nasal bones, left maxilla and left zygoma appear intact. Central
skull base appears intact.

Orbits: Left orbital walls are intact.

There is a nondisplaced fracture of the right orbital floor (series
11, image 33). There is trace right intraorbital extraconal gas. No
herniated orbital contents. No orbital hematoma. No lateral orbital
wall fracture identified.

Sinuses: Trace mucosal thickening. No hemorrhage layering in the
sinuses.

Soft tissues: Negative visible noncontrast larynx, pharynx,
parapharyngeal spaces, retropharyngeal space, sublingual space,
submandibular spaces, parotid spaces, and masticator spaces.

There is a broad-based right mandible region superficial soft tissue
contusion or hematoma on series 7, image 16.

Visible upper cervical nodes appear normal.

CT CERVICAL SPINE FINDINGS

Alignment: Straightening and mild reversal of cervical lordosis.
Cervicothoracic junction alignment is within normal limits.
Bilateral posterior element alignment is within normal limits.

Skull base and vertebrae: Visualized skull base is intact. No
atlanto-occipital dissociation. No cervical spine fracture
identified.

Soft tissues and spinal canal: No prevertebral fluid or swelling. No
visible canal hematoma.

Disc levels: Disc and endplate degeneration maximal at C4-C5 and
C5-C6 with up to mild associated cervical spinal stenosis.

Upper chest: Visible upper thoracic levels appear grossly intact.
Negative lung apices.

Elongated stylohyoid ligament calcification greater

Other: On the right.
IMPRESSION: 1. Minimally to non-displaced fractures of:
- anterior and posterior walls of the right maxillary sinus.
- right orbital floor.
- right zygomatic arch.
2. Normal noncontrast CT appearance of the brain.
3. No acute traumatic injury identified in the cervical spine. Mild
degenerative spinal stenosis suspected.

## 2020-10-24 IMAGING — DX DG HAND COMPLETE 3+V*L*
3 series · 3 of 3 positions shown · non-contrast
Comparison: None.

CLINICAL DATA: Lacerations

EXAM:
LEFT HAND - COMPLETE 3+ VIEW

[hand ap]
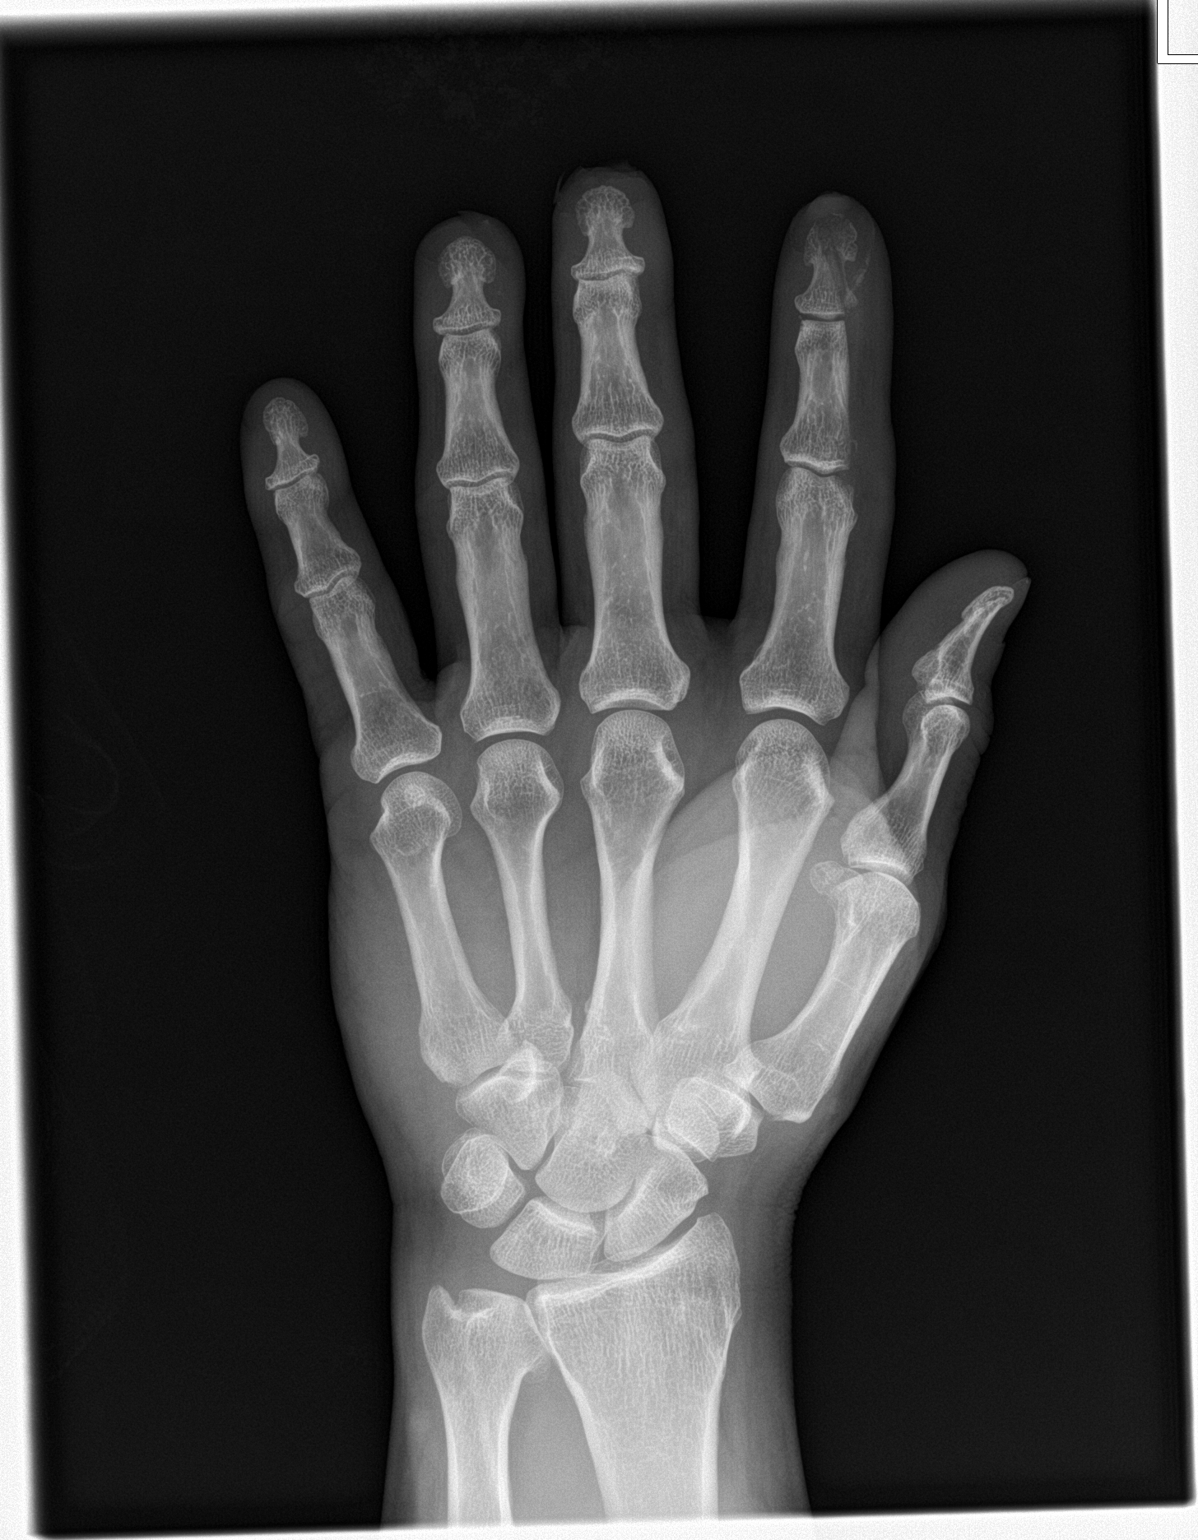

[hand obl]
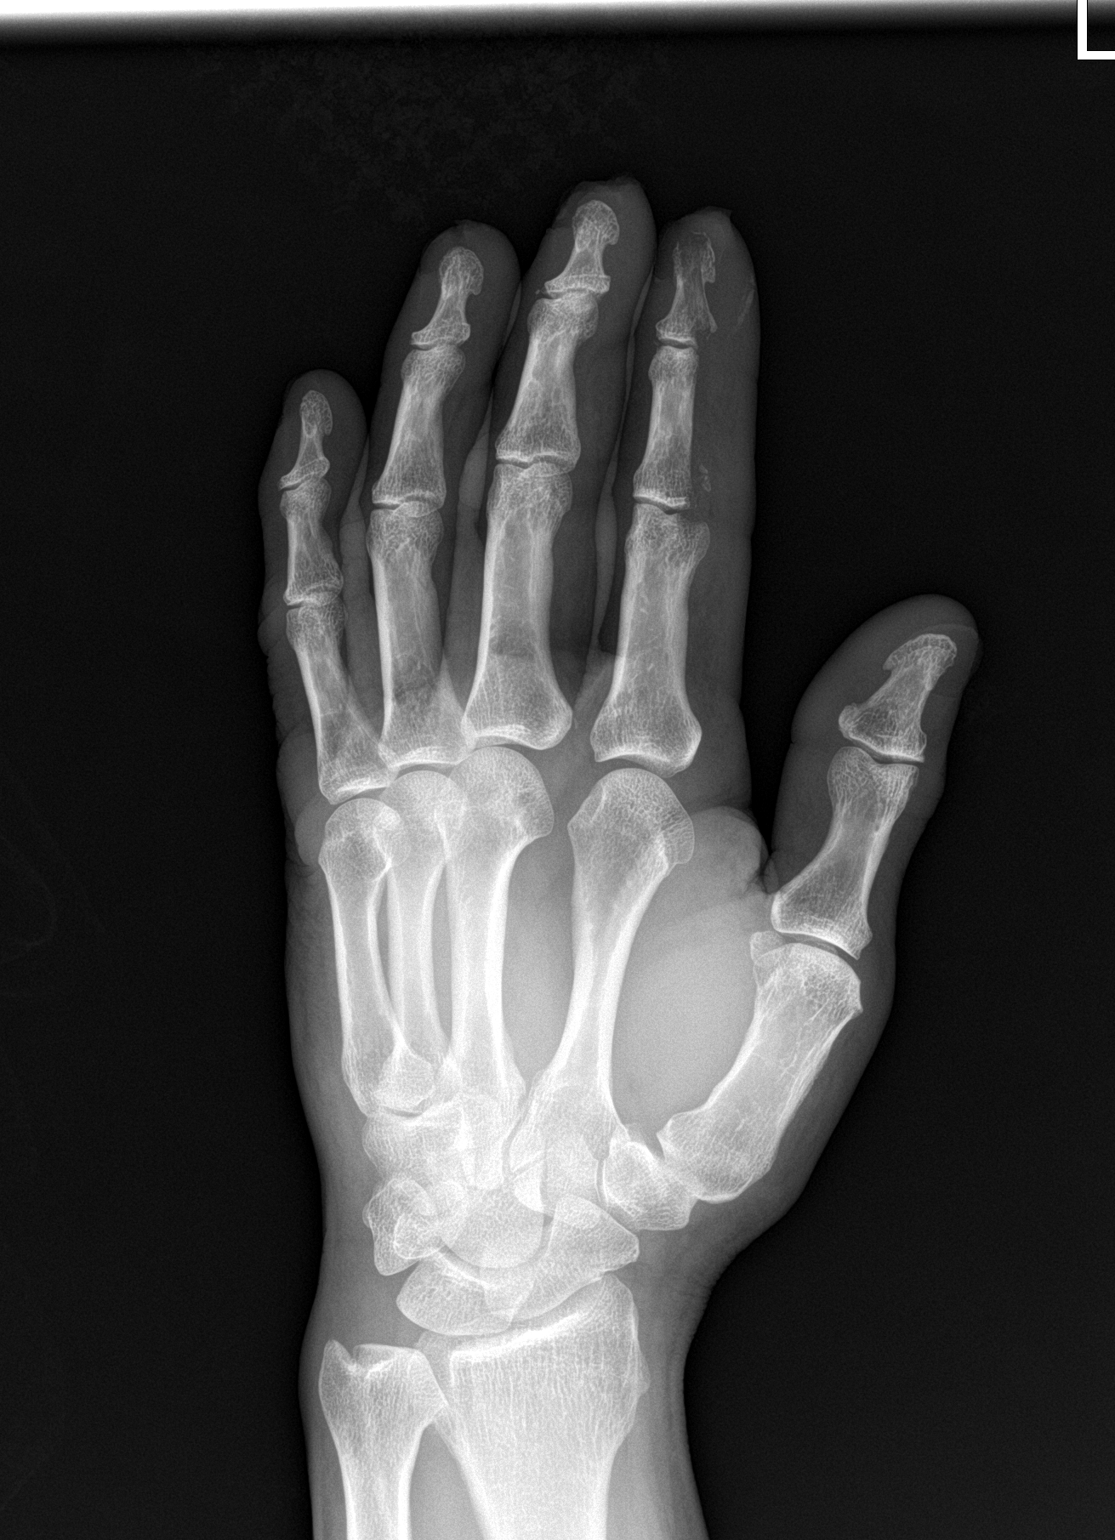

[hand lat]
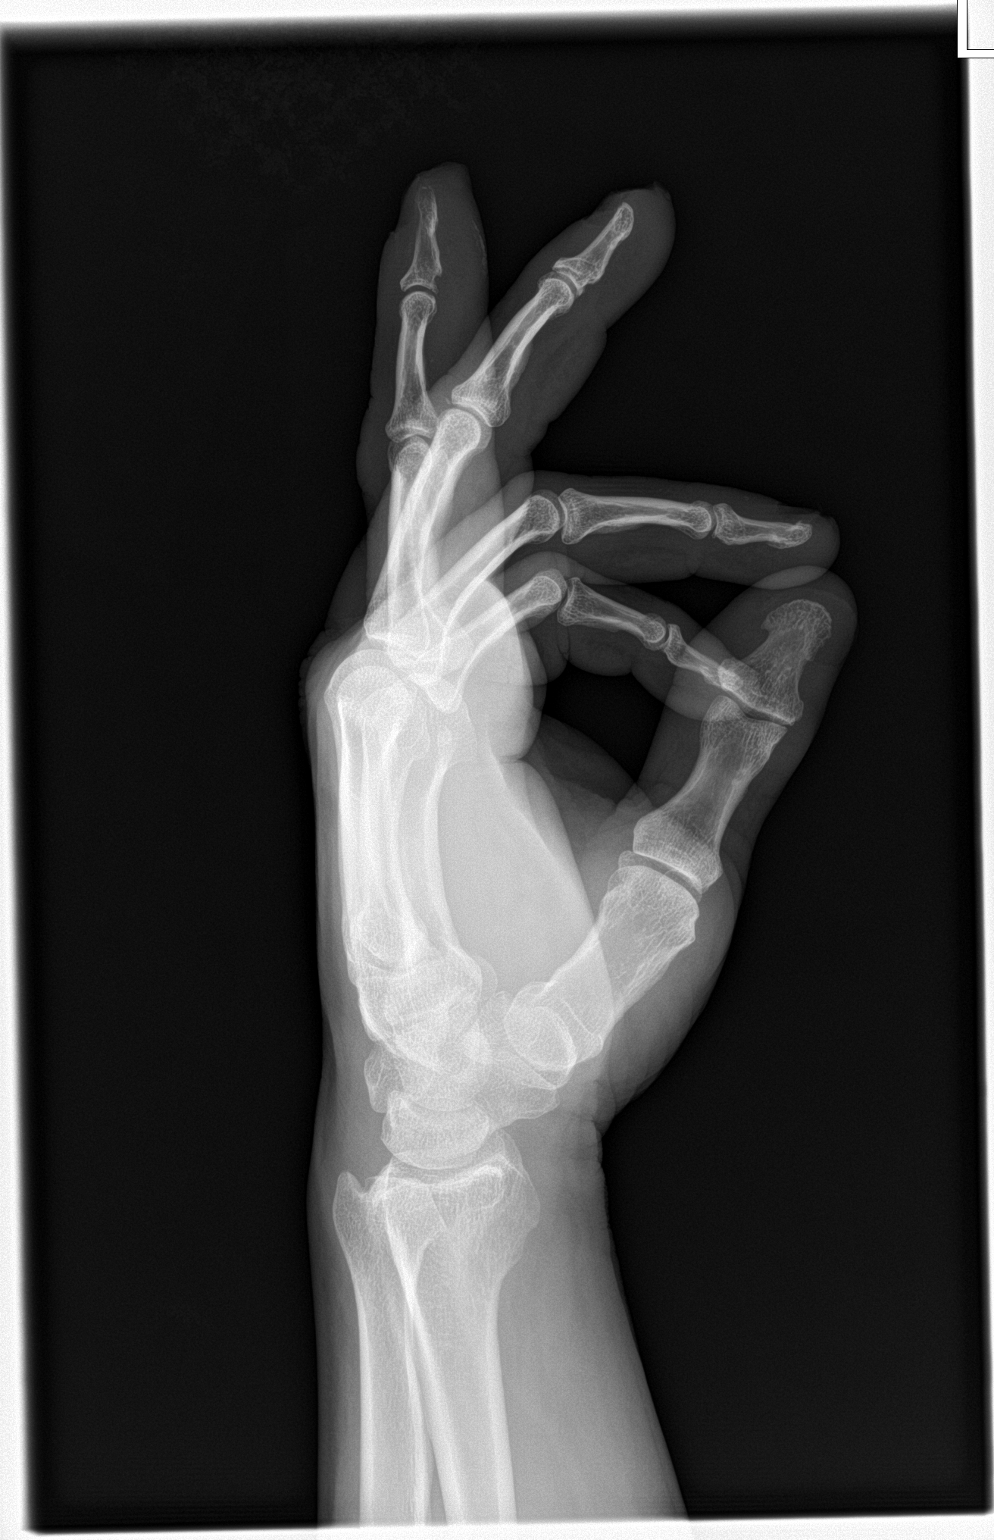

[3 of 3 positions shown; findings below may reference images not displayed]

FINDINGS: Soft tissue lacerations at the distal second third and fourth
digits. Tubular lucency extending from the tuft to the articular
surface base of the second distal phalanx presumably fracture and
laceration. Apparent bony amputation of the middle phalanx from head
to base of the phalanx on the radial side. Small fracture fragments
adjacent to the base of the second middle phalanx with defect at the
head of the proximal phalanx.
IMPRESSION: 1. Apparent bony amputation of the second middle phalanx oriented
along the long axis of the bone, and extending from the head of the
phalanx to the articular surface base. Small displaced fracture
involving the head of the proximal phalanx of the second digit.
Tubular lucency extends from the articular surface base of the
distal phalanx to the tuft and would also be consistent with acute
osseous injury.
